# Patient Record
Sex: Male | Born: 2000 | Race: White | Hispanic: No | Marital: Single | Smoking: Never smoker
Health system: Southern US, Community
[De-identification: ages and names within clinical notes are randomized; demographics above are authoritative.]

## PROBLEM LIST (undated history)

## (undated) DIAGNOSIS — Z2839 Other underimmunization status: Secondary | ICD-10-CM

## (undated) DIAGNOSIS — K219 Gastro-esophageal reflux disease without esophagitis: Secondary | ICD-10-CM

## (undated) DIAGNOSIS — L988 Other specified disorders of the skin and subcutaneous tissue: Secondary | ICD-10-CM

## (undated) DIAGNOSIS — Z9189 Other specified personal risk factors, not elsewhere classified: Secondary | ICD-10-CM

## (undated) HISTORY — PX: TYMPANOSTOMY TUBE PLACEMENT: SHX32

## (undated) HISTORY — PX: OTHER SURGICAL HISTORY: SHX169

---
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
## 2018-03-19 IMAGING — CT CT ABD-PELV W/ CM
2 of 4 series · 15 of 46 positions shown, 17 images · IV contrast (APPLIED)
Comparison: None.

CLINICAL DATA: Chronic rectal fistula or rectal abscess. Initial
encounter.

EXAM:
CT ABDOMEN AND PELVIS WITH CONTRAST
TECHNIQUE: Multidetector CT imaging of the abdomen and pelvis was performed
using the standard protocol following bolus administration of
intravenous contrast.
CONTRAST:  100mL NFRAL2-YNN IOPAMIDOL (NFRAL2-YNN) INJECTION 61%

[Series 2: axial st · axial · 0.79mm/px · z∈[+987,+1447]mm · 12 of 102 slices shown, 14 images]
[im 5/102  soft-tissue]
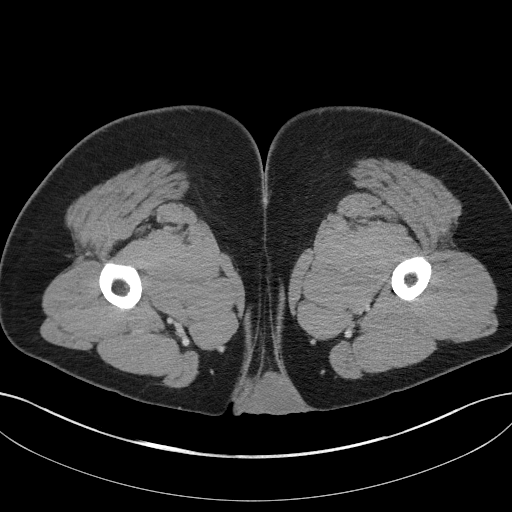
[im 5/102  bone]
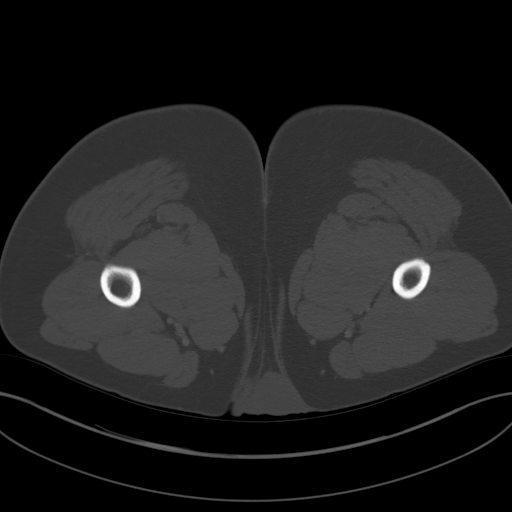
[im 13/102  soft-tissue]
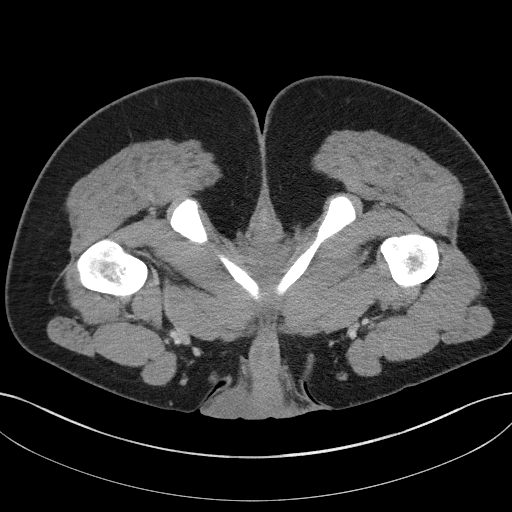
[im 22/102  soft-tissue]
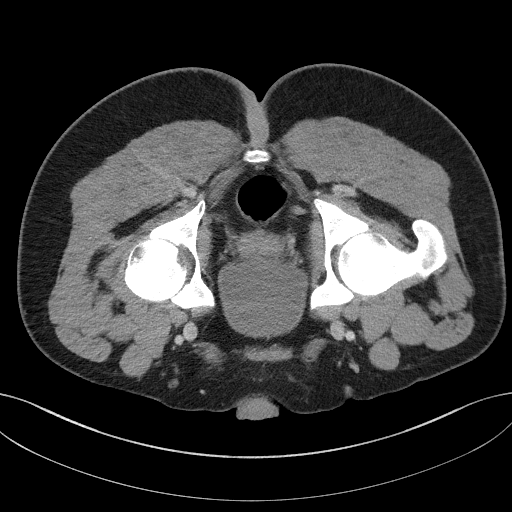
[im 30/102  soft-tissue]
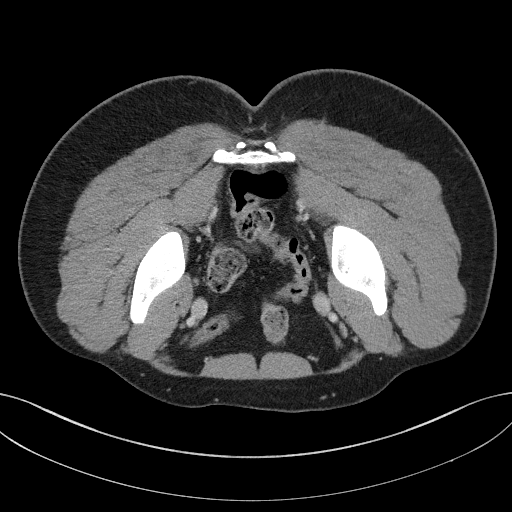
[im 38/102  soft-tissue]
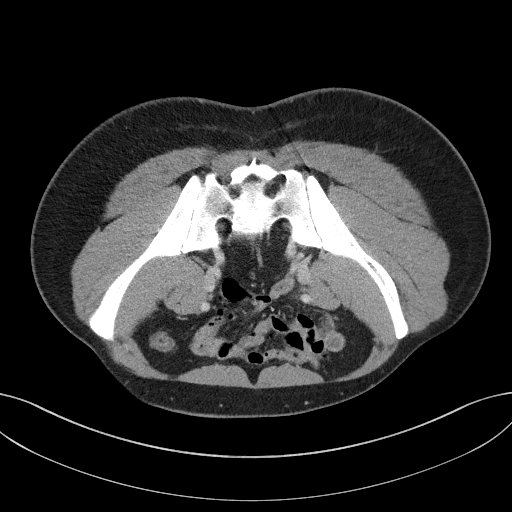
[im 47/102  soft-tissue]
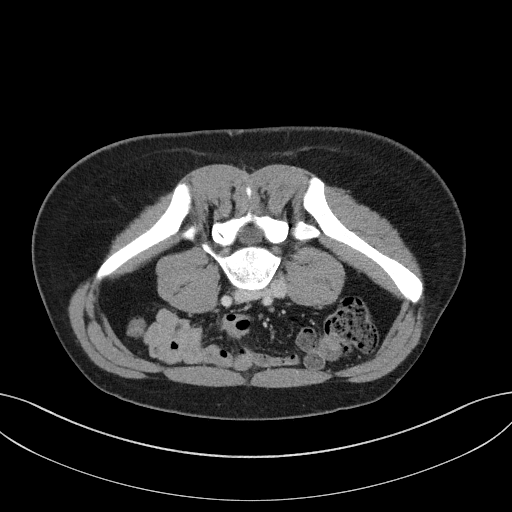
[im 55/102  soft-tissue]
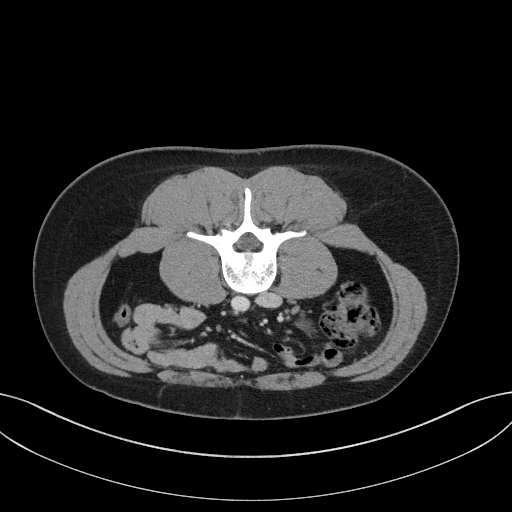
[im 64/102  soft-tissue]
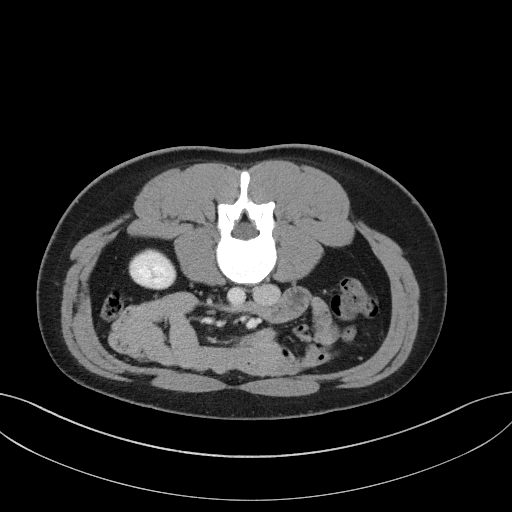
[im 72/102  soft-tissue]
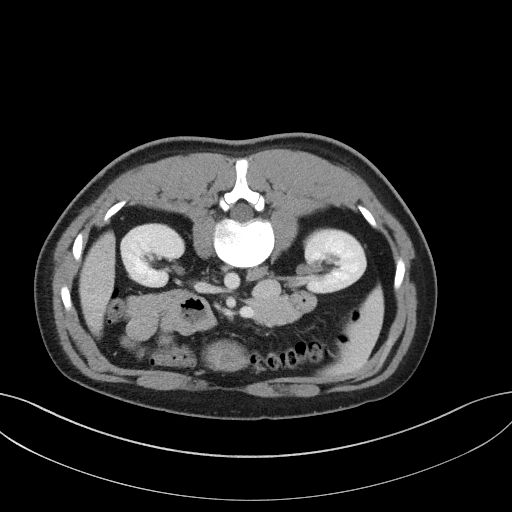
[im 72/102  bone]
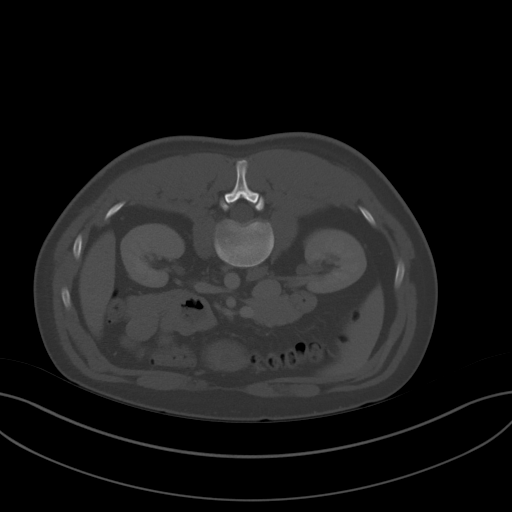
[im 80/102  soft-tissue]
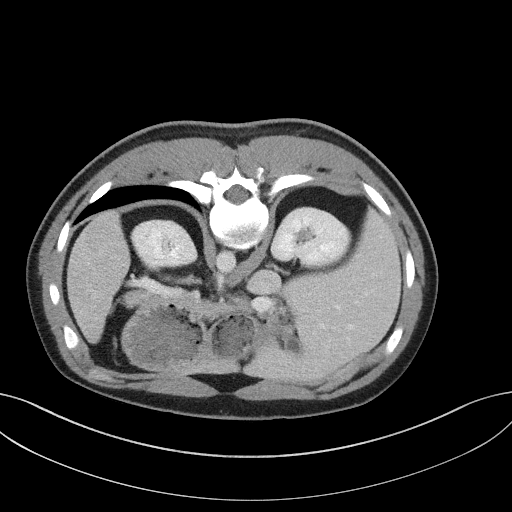
[im 89/102  soft-tissue]
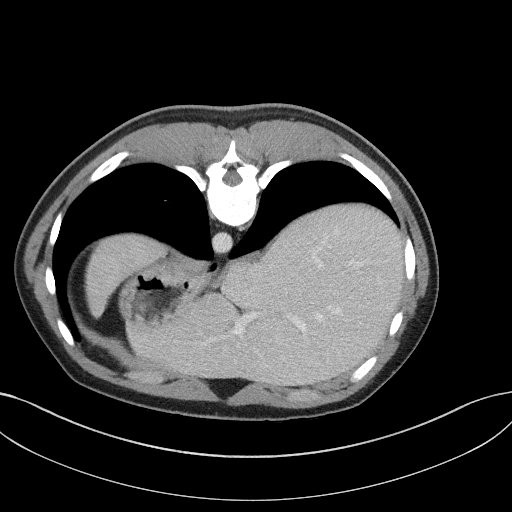
[im 97/102  soft-tissue]
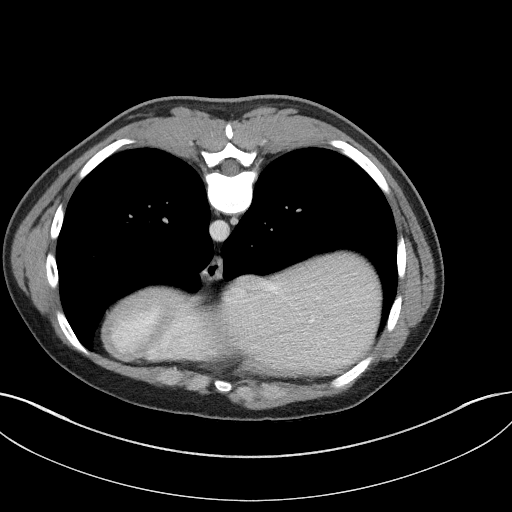

[Series 5: coronal st · coronal · 0.74mm/px · 3 of 96 slices shown]
[im 32/96  soft-tissue]
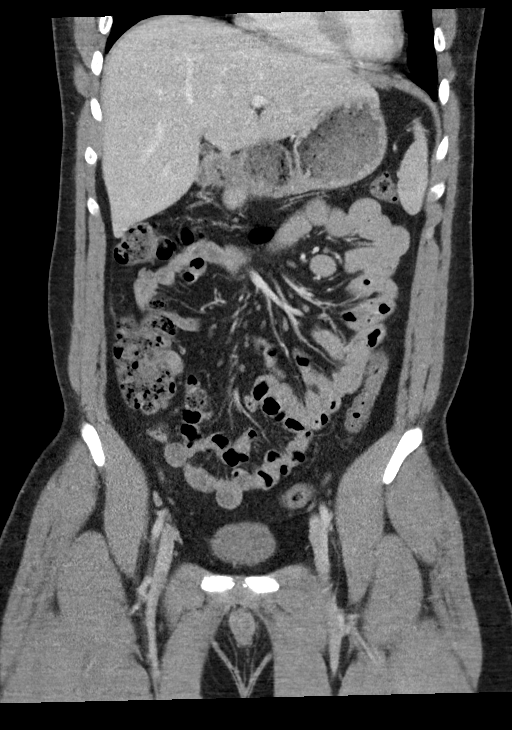
[im 43/96  soft-tissue]
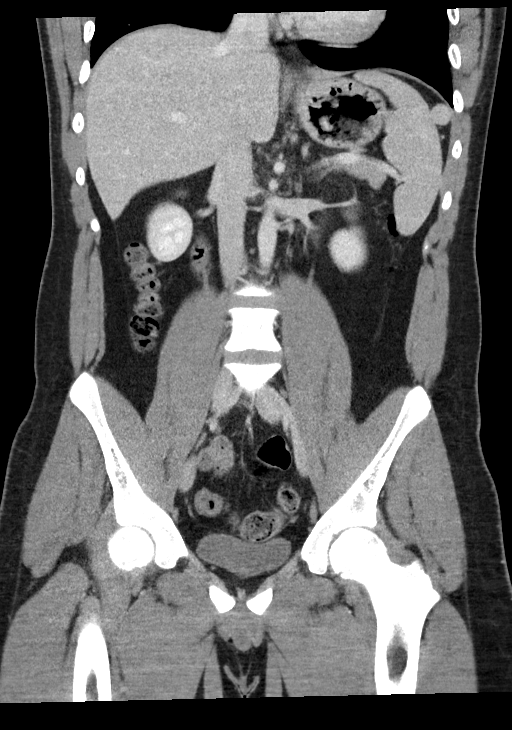
[im 53/96  soft-tissue]
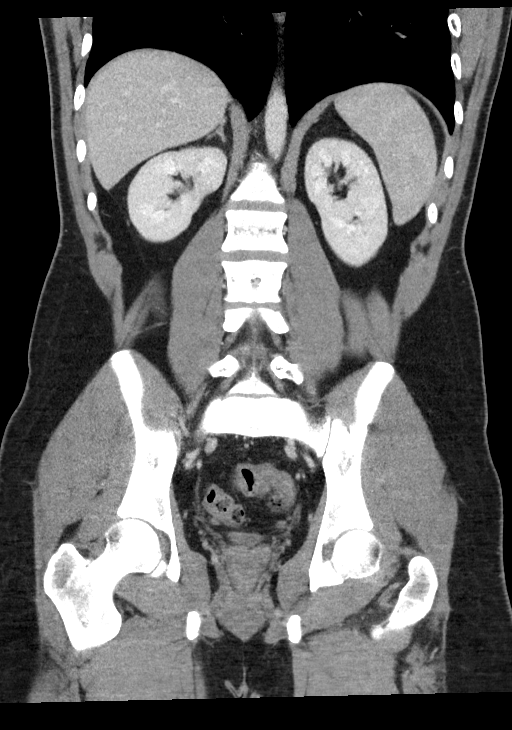

[15 of 46 positions shown; findings below may reference images not displayed]

FINDINGS: Lower chest: The visualized lung bases are grossly clear. The
visualized portions of the mediastinum are unremarkable.

Hepatobiliary: The liver is unremarkable in appearance. The
gallbladder is unremarkable in appearance. The common bile duct
remains normal in caliber.

Pancreas: The pancreas is within normal limits.

Spleen: The spleen is unremarkable in appearance.

Adrenals/Urinary Tract: The adrenal glands are unremarkable in
appearance. The kidneys are within normal limits. There is no
evidence of hydronephrosis. No renal or ureteral stones are
identified. No perinephric stranding is seen.

Stomach/Bowel: The stomach is unremarkable in appearance. The small
bowel is within normal limits. The appendix is normal in caliber,
without evidence of appendicitis. The colon is unremarkable in
appearance.

Vascular/Lymphatic: The abdominal aorta is unremarkable in
appearance. The inferior vena cava is grossly unremarkable. No
retroperitoneal lymphadenopathy is seen. No pelvic sidewall
lymphadenopathy is identified.

Reproductive: The bladder is mildly distended and grossly
unremarkable. The prostate remains normal in size.

Other: The anorectal canal is grossly unremarkable in appearance on
CT. No abscess is seen.

Musculoskeletal: No acute osseous abnormalities are identified. The
visualized musculature is unremarkable in appearance.
IMPRESSION: Unremarkable contrast-enhanced CT of the abdomen and pelvis. The
appearance of the anorectal canal is grossly unremarkable on CT. No
abscess seen.
